# Patient Record
Sex: Male | Born: 1985 | Race: White | Hispanic: No | Marital: Single | Smoking: Never smoker
Health system: Southern US, Community
[De-identification: ages and names within clinical notes are randomized; demographics above are authoritative.]

## PROBLEM LIST (undated history)

## (undated) DIAGNOSIS — I10 Essential (primary) hypertension: Secondary | ICD-10-CM

## (undated) HISTORY — DX: Essential (primary) hypertension: I10

---
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
## 2006-07-29 IMAGING — CR RIGHT TIBIA AND FIBULA - 2 VIEW
1 series · 2 of 2 positions shown · non-contrast
Comparison: none

REASON FOR EXAM: EVALUTE FOR SOFT TISSUE OF LOWER EXTREMITITY (CALL REPORT)
COMMENTS:

[Series 1: view not recorded · 0.17mm/px · 2 of 2 slices shown]
[im 1/2]
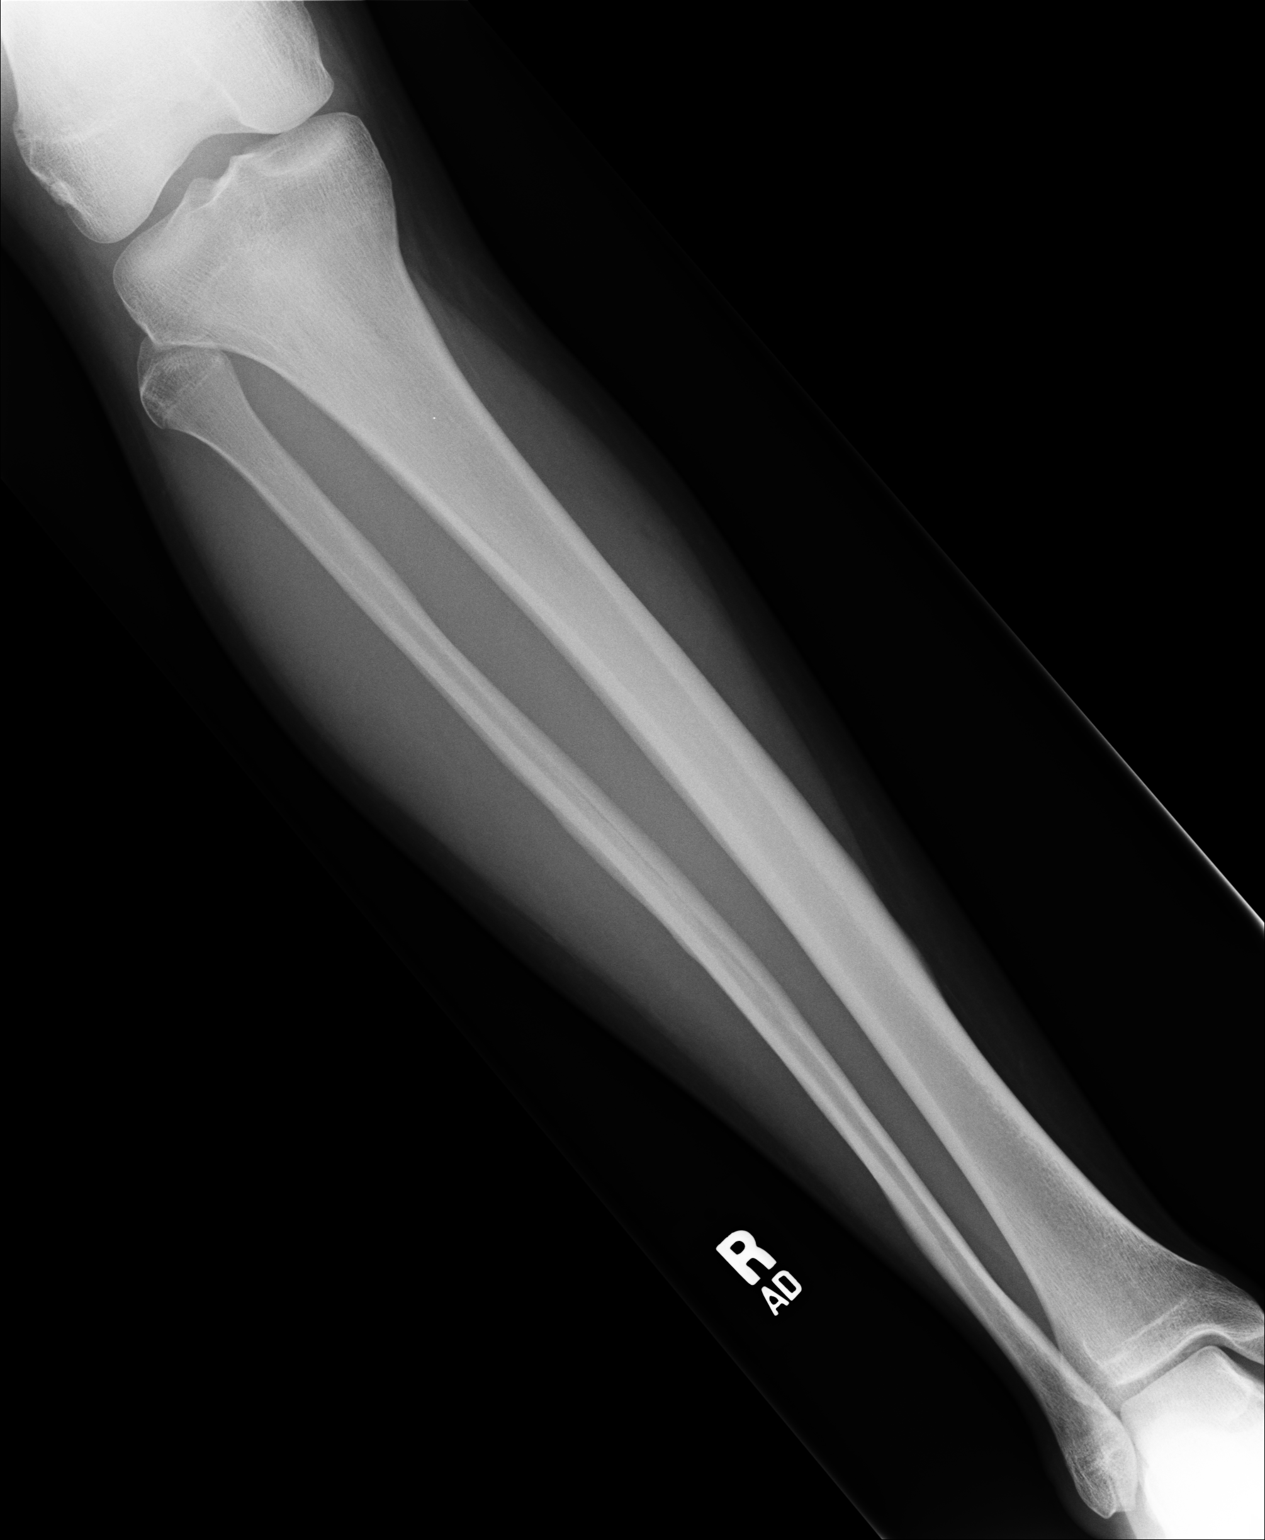
[im 2/2]
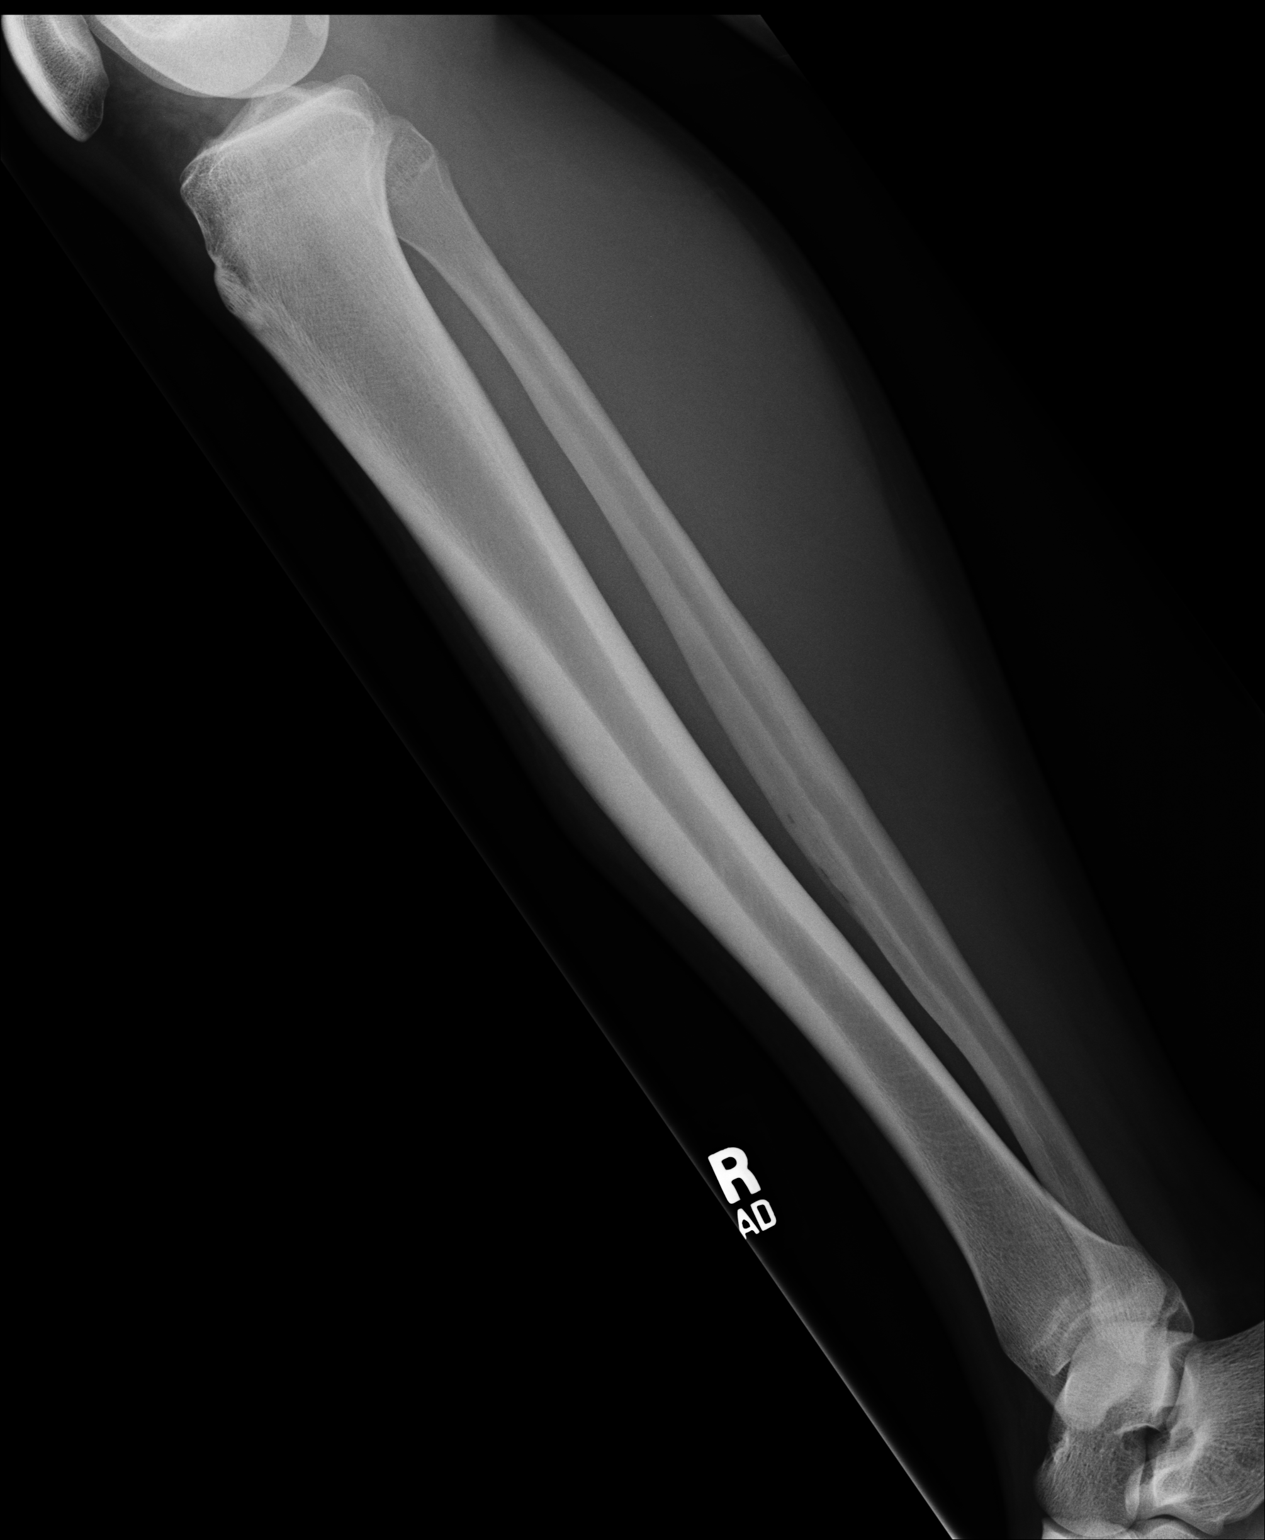

[2 of 2 positions shown; findings below may reference images not displayed]

PROCEDURE:     DXR - DXR TIBIA AND FIBULA RT (LOWER L  - March 27, 2005 [DATE]

RESULT:       AP and lateral views of the RIGHT lower leg show periosteal
thickening involving the mid shafts of the fibula and to a lesser extent the
midshaft of the RIGHT tibia.  The etiology for this is not certain.
Residual change from prior trauma or infection would be the primary
considerations.  If the patient is symptomatic in the midportion of the
RIGHT lower leg, then Bone Scan would be recommended to evaluate for active
disease.

No associated soft tissue abnormality is seen.  No radiodense foreign body
is noted. The patient reportedly has a knot on the anterior aspect of the
lower leg.  The etiology for this is not identified.
IMPRESSION: There is periosteal reaction involving the fibula and tibia. Bone Scan is
suggested for further evaluation.  Alternatively, MR could be performed
which would also provide the ability to evaluate the soft tissues.

## 2006-07-29 IMAGING — CR DG TIBIA/FIBULA 2V*L*
1 series · 2 of 2 positions shown · non-contrast
Comparison: none

REASON FOR EXAM: Evaluate for soft tissue of lower extremitity
                     (CALL REPORT)
COMMENTS:

[Series 1: view not recorded · 0.17mm/px · 2 of 2 slices shown]
[im 1/2]
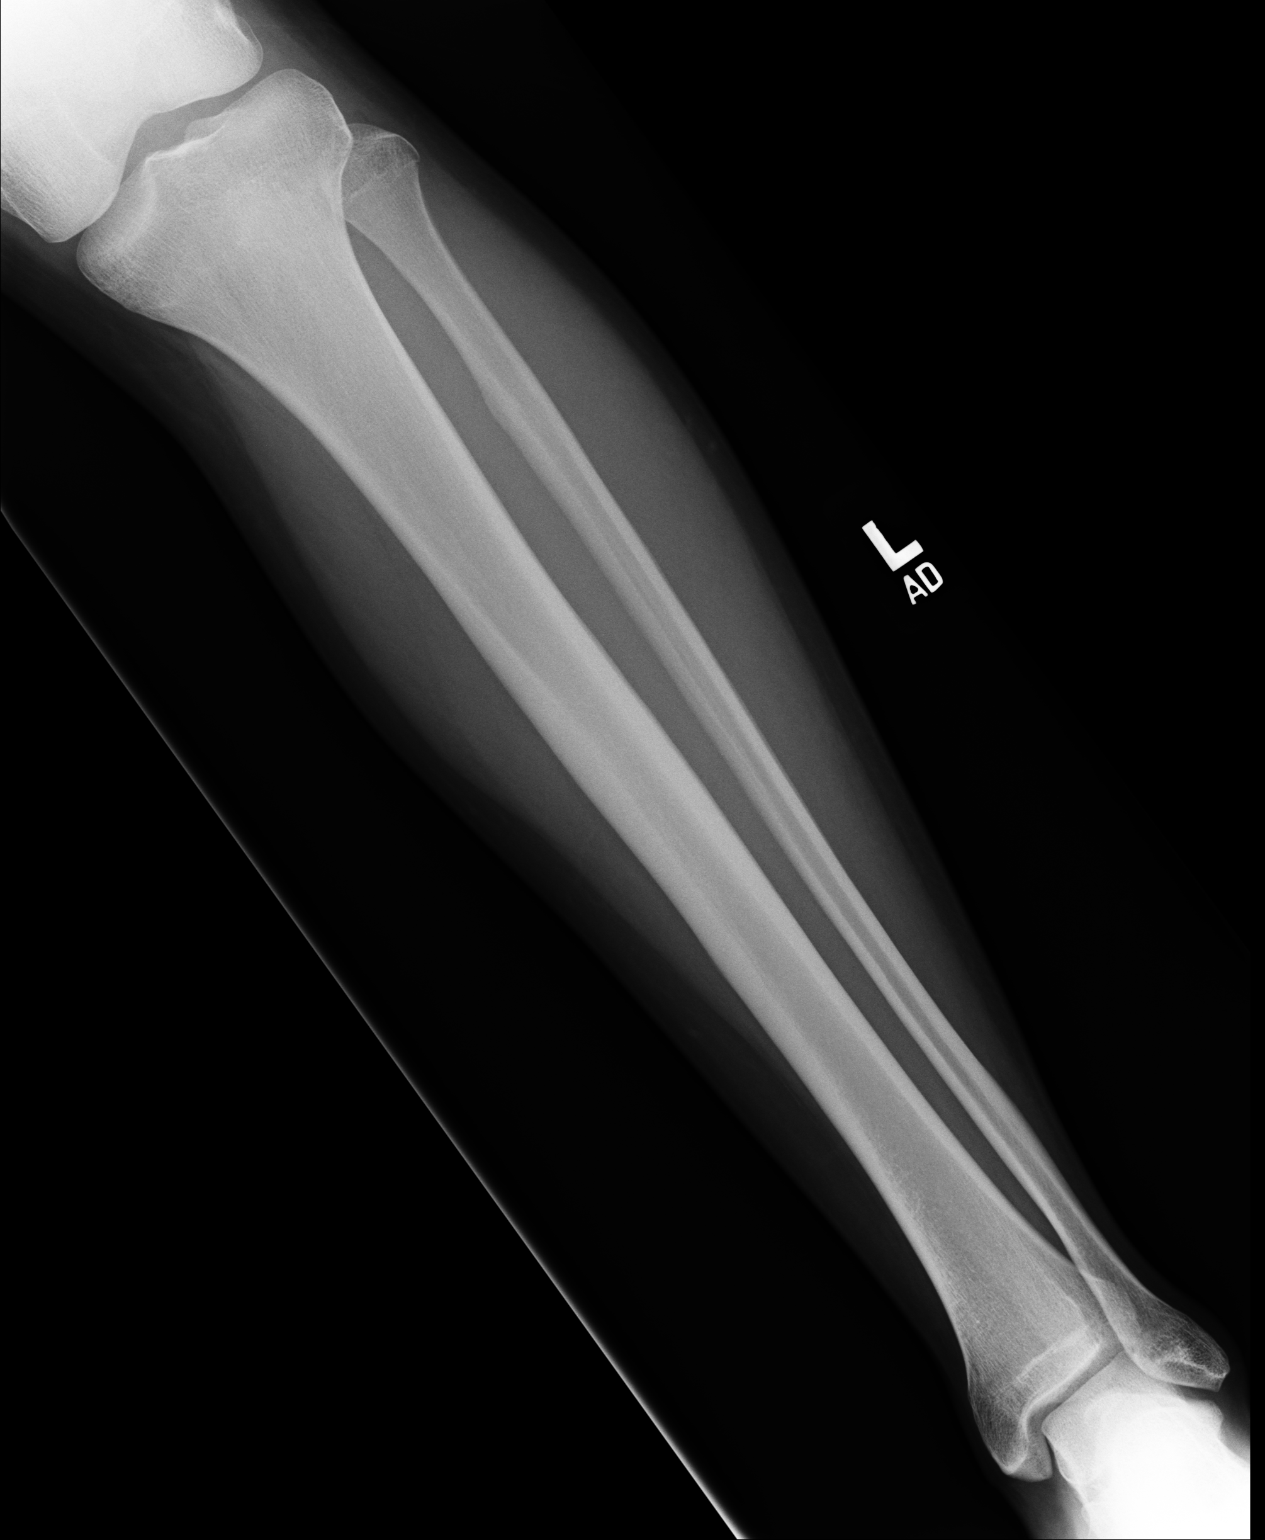
[im 2/2]
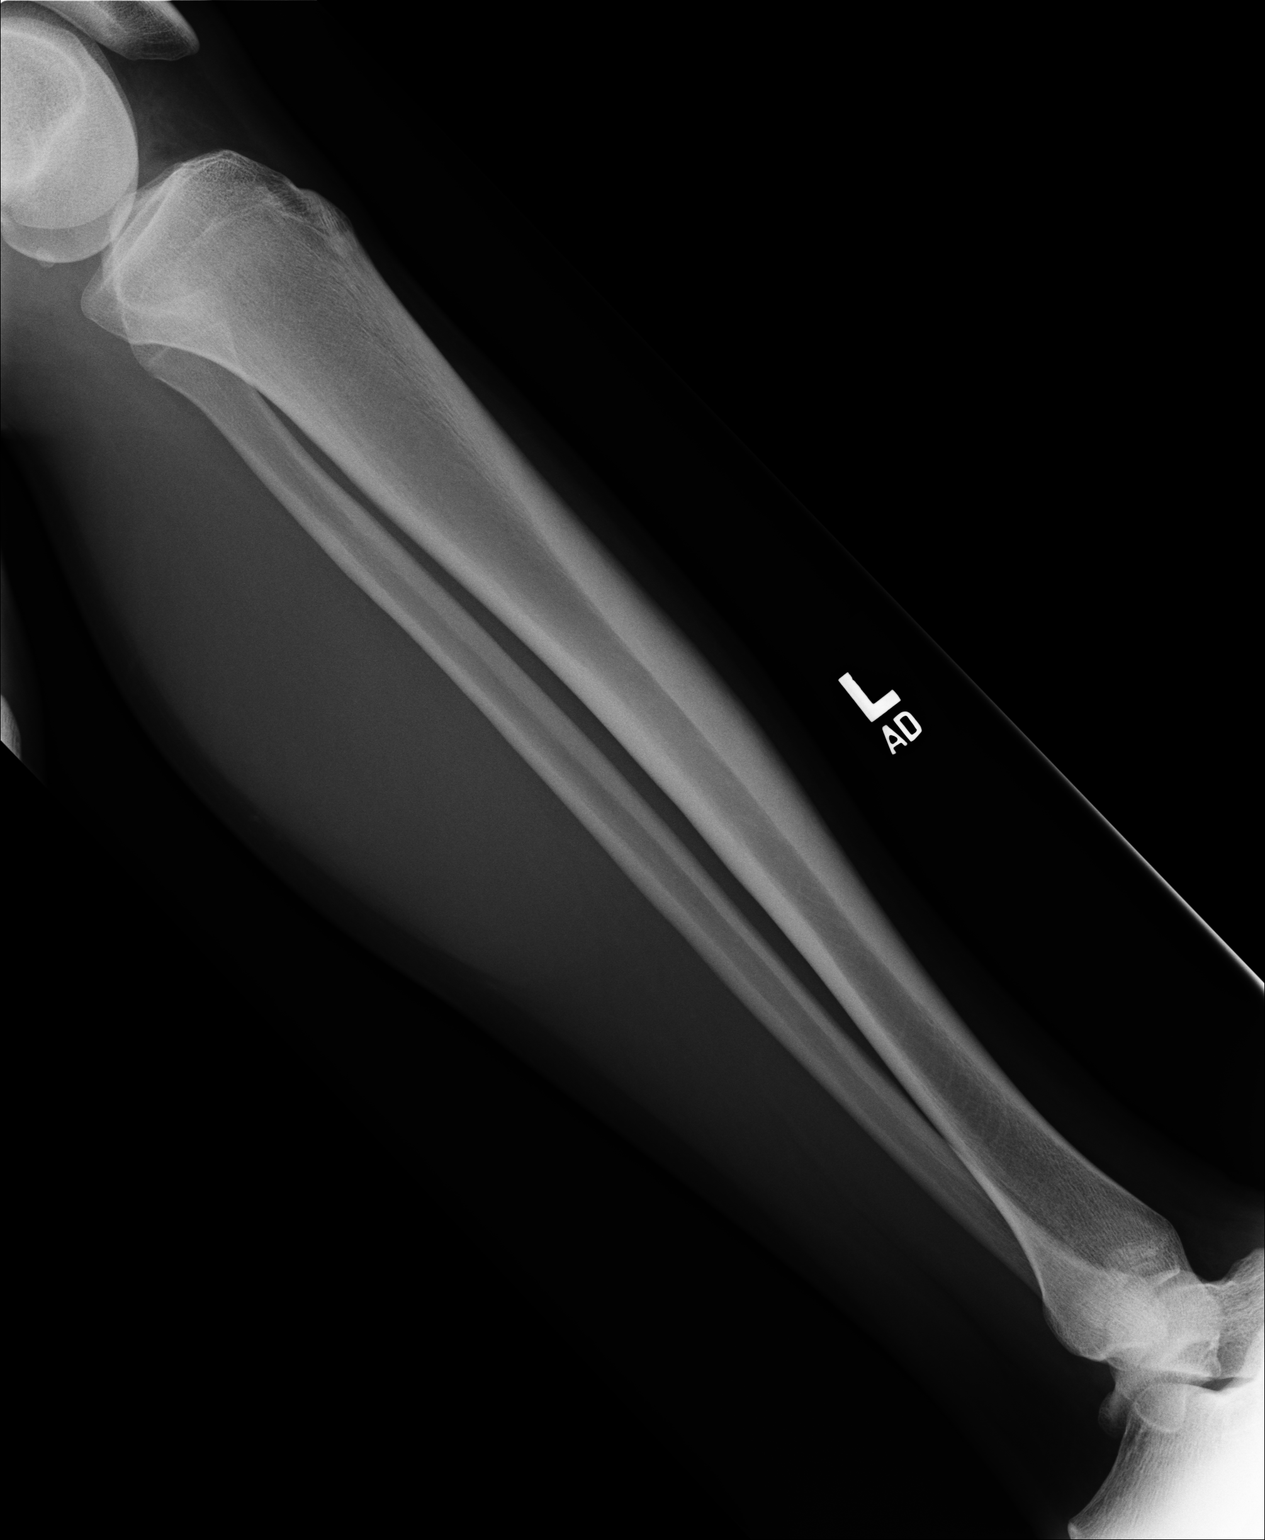

[2 of 2 positions shown; findings below may reference images not displayed]

PROCEDURE:     DXR - DXR TIBIA AND FIBULA LT (LOWER L  - March 27, 2005 [DATE]

RESULT:     AP and lateral views of the LEFT tibia and fibula show no
fracture, dislocation or other acute bony abnormality. No definite
periosteal reactive changes are seen on the LEFT. No soft tissue abnormality
is identified.
IMPRESSION: No significant abnormalities are noted.

## 2018-10-01 DIAGNOSIS — B349 Viral infection, unspecified: Secondary | ICD-10-CM | POA: Diagnosis not present

## 2018-10-01 DIAGNOSIS — J069 Acute upper respiratory infection, unspecified: Secondary | ICD-10-CM | POA: Diagnosis not present

## 2019-09-15 DIAGNOSIS — Z20828 Contact with and (suspected) exposure to other viral communicable diseases: Secondary | ICD-10-CM | POA: Diagnosis not present

## 2019-09-15 DIAGNOSIS — U071 COVID-19: Secondary | ICD-10-CM | POA: Diagnosis not present

## 2019-12-24 ENCOUNTER — Ambulatory Visit: Payer: Self-pay | Attending: Internal Medicine

## 2019-12-24 DIAGNOSIS — Z23 Encounter for immunization: Secondary | ICD-10-CM

## 2019-12-24 NOTE — Progress Notes (Signed)
   Covid-19 Vaccination Clinic  Name:  JACOBO MONCRIEF    MRN: [id number] DOB: 02-05-1986  12/24/2019  Mr. Blau was observed post Covid-19 immunization for 15 minutes without incident. He was provided with Vaccine Information Sheet and instruction to access the V-Safe system.   Mr. Aultman was instructed to call 911 with any severe reactions post vaccine: Marland Kitchen Difficulty breathing  . Swelling of face and throat  . A fast heartbeat  . A bad rash all over body  . Dizziness and weakness   Immunizations Administered    Name Date Dose VIS Date Route   Pfizer COVID-19 Vaccine 12/24/2019  5:49 PM 0.3 mL 09/09/2019 Intramuscular   Manufacturer: [employer], [employer]   Lot: [id number]   NDC: [address]-4

## 2020-01-14 ENCOUNTER — Other Ambulatory Visit: Payer: Self-pay

## 2020-01-14 ENCOUNTER — Ambulatory Visit: Payer: Self-pay | Attending: Internal Medicine

## 2020-01-14 DIAGNOSIS — Z23 Encounter for immunization: Secondary | ICD-10-CM

## 2020-01-14 NOTE — Progress Notes (Signed)
   Covid-19 Vaccination Clinic  Name:  Logan Rivas    MRN: [id number] DOB: Jun 27, 1986  01/14/2020  Mr. Logan Rivas was observed post Covid-19 immunization for 15 minutes without incident. He was provided with Vaccine Information Sheet and instruction to access the V-Safe system.   Mr. Logan Rivas was instructed to call 911 with any severe reactions post vaccine: Marland Kitchen Difficulty breathing  . Swelling of face and throat  . A fast heartbeat  . A bad rash all over body  . Dizziness and weakness   Immunizations Administered    Name Date Dose VIS Date Route   Pfizer COVID-19 Vaccine 01/14/2020  5:38 PM 0.3 mL 09/09/2019 Intramuscular   Manufacturer: [employer], [employer]   Lot: [id number]   NDC: [address]-4

## 2020-06-01 ENCOUNTER — Encounter: Payer: Self-pay | Admitting: Internal Medicine

## 2020-06-01 ENCOUNTER — Ambulatory Visit (INDEPENDENT_AMBULATORY_CARE_PROVIDER_SITE_OTHER): Payer: BC Managed Care – PPO | Admitting: Internal Medicine

## 2020-06-01 ENCOUNTER — Other Ambulatory Visit: Payer: Self-pay

## 2020-06-01 VITALS — BP 160/100 | HR 81 | Ht 71.0 in | Wt 186.6 lb

## 2020-06-01 DIAGNOSIS — Z23 Encounter for immunization: Secondary | ICD-10-CM | POA: Diagnosis not present

## 2020-06-01 DIAGNOSIS — I10 Essential (primary) hypertension: Secondary | ICD-10-CM | POA: Insufficient documentation

## 2020-06-01 LAB — POCT URINALYSIS DIPSTICK
Bilirubin, UA: NEGATIVE
Blood, UA: NEGATIVE
Glucose, UA: NEGATIVE
Ketones, UA: NEGATIVE
Leukocytes, UA: NEGATIVE
Nitrite, UA: NEGATIVE
Protein, UA: NEGATIVE
Spec Grav, UA: 1.015 (ref 1.010–1.025)
Urobilinogen, UA: NEGATIVE E.U./dL — AB
pH, UA: 7 (ref 5.0–8.0)

## 2020-06-01 MED ORDER — METOPROLOL SUCCINATE ER 50 MG PO TB24: 50.0000 mg | ORAL_TABLET | Freq: Every day | ORAL | 3 refills | Status: AC

## 2020-06-01 NOTE — Assessment & Plan Note (Signed)
He was started on metroprolol for HTN. He denies any chest pain or shortness of breath. There is no evidence of kidney problem. There is no swelling of the legs. He has a family hx of HTN. His blood pressure was checked by me again and it was 160/100, so he was started on a beta blocker. We'll do a CBC and CMP to evaluate the liver and kidney function. He had a history of COVID19 late 2020. He does not have any symptoms right now and has taken both COVID19 vaccines. He will be given a flu shot today.

## 2020-06-01 NOTE — Addendum Note (Signed)
Addended by: Jobie Quaker on: 06/01/2020 03:27 PM   Modules accepted: Orders

## 2020-06-01 NOTE — Patient Instructions (Signed)
Hypertension, Adult Hypertension is another name for high blood pressure. High blood pressure forces your heart to work harder to pump blood. This can cause problems over time. There are two numbers in a blood pressure reading. There is a top number (systolic) over a bottom number (diastolic). It is best to have a blood pressure that is below 120/80. Healthy choices can help lower your blood pressure, or you may need medicine to help lower it.  What are the causes? The cause of this condition is not known. Some conditions may be related to high blood pressure.  What increases the risk?  Smoking.  Having type 2 diabetes mellitus, high cholesterol, or both.  Not getting enough exercise or physical activity.  Being overweight.  Having too much fat, sugar, calories, or salt (sodium) in your diet.  Drinking too much alcohol.  Having long-term (chronic) kidney disease.  Having a family history of high blood pressure.  Age. Risk increases with age.  Race. You may be at higher risk if you are African American.  Gender. Men are at higher risk than women before age 51. After age 36, women are at higher risk than men.  Having obstructive sleep apnea.  Stress.  What are the signs or symptoms? 1. High blood pressure may not cause symptoms. Very high blood pressure (hypertensive crisis) may cause: ? Headache. ? Feelings of worry or nervousness (anxiety). ? Shortness of breath. ? Nosebleed. ? A feeling of being sick to your stomach (nausea). ? Throwing up (vomiting). ? Changes in how you see. ? Very bad chest pain. ? Seizures.  How is this treated? 1. This condition is treated by making healthy lifestyle changes, such as: ? Eating healthy foods. ? Exercising more. ? Drinking less alcohol. 2. Your health care provider may prescribe medicine if lifestyle changes are not enough to get your blood pressure under control, and if: ? Your top number is above 130. ? Your bottom number  is above 80. 3. Your personal target blood pressure may vary.  Follow these instructions at home: Eating and drinking  1. If told, follow the DASH eating plan. To follow this plan: 1. Fill one half of your plate at each meal with fruits and vegetables. 2. Fill one fourth of your plate at each meal with whole grains. Whole grains include whole-wheat pasta, brown rice, and whole-grain bread. 3. Eat or drink low-fat dairy products, such as skim milk or low-fat yogurt. 4. Fill one fourth of your plate at each meal with low-fat (lean) proteins. Low-fat proteins include fish, chicken without skin, eggs, beans, and tofu. 5. Avoid fatty meat, cured and processed meat, or chicken with skin. 6. Avoid pre-made or processed food. 2. Eat less than 1,500 mg of salt each day. 3. Do not drink alcohol if: 1. Your doctor tells you not to drink. 2. You are pregnant, may be pregnant, or are planning to become pregnant. 4. If you drink alcohol: 1. Limit how much you use to:  0-1 drink a day for women.  0-2 drinks a day for men. 2. Be aware of how much alcohol is in your drink. In the [place] one drink equals one 12 oz bottle of beer (355 mL), one 5 oz glass of wine (148 mL), or one 1 oz glass of hard liquor (44 mL).  Lifestyle   Work with your doctor to stay at a healthy weight or to lose weight. Ask your doctor what the best weight is for you.  Get at  least 30 minutes of exercise most days of the week. This may include walking, swimming, or biking.  Get at least 30 minutes of exercise that strengthens your muscles (resistance exercise) at least 3 days a week. This may include lifting weights or doing Pilates.  Do not use any products that contain nicotine or tobacco, such as cigarettes, e-cigarettes, and chewing tobacco. If you need help quitting, ask your doctor.  Check your blood pressure at home as told by your doctor.  Keep all follow-up visits as told by your doctor. This is  important.  Medicines  Take over-the-counter and prescription medicines only as told by your doctor. Follow directions carefully.  Do not skip doses of blood pressure medicine. The medicine does not work as well if you skip doses. Skipping doses also puts you at risk for problems.  Ask your doctor about side effects or reactions to medicines that you should watch for.  Contact a doctor if you:  Think you are having a reaction to the medicine you are taking.  Have headaches that keep coming back (recurring).  Feel dizzy.  Have swelling in your ankles.  Have trouble with your vision.  Get help right away if you: 1. Get a very bad headache. 2. Start to feel mixed up (confused). 3. Feel weak or numb. 4. Feel faint. 5. Have very bad pain in your: ? Chest. ? Belly (abdomen). 6. Throw up more than once. 7. Have trouble breathing.  Summary  Hypertension is another name for high blood pressure.  High blood pressure forces your heart to work harder to pump blood.  For most people, a normal blood pressure is less than 120/80.  Making healthy choices can help lower blood pressure. If your blood pressure does not get lower with healthy choices, you may need to take medicine.  This information is not intended to replace advice given to you by your health care provider. Make sure you discuss any questions you have with your health care provider.  Document Revised: 05/26/2018 Document Reviewed: 05/26/2018 Elsevier Patient Education  2020 [employer].

## 2020-06-01 NOTE — Progress Notes (Signed)
Established Patient Office Visit  SUBJECTIVE:  Subjective  Patient ID: Logan Rivas, male    DOB: [birth year]  Age: 34 y.o. MRN: [id number]  CC:  Chief Complaint  Patient presents with  . New Patient (Initial Visit)    patient has concerns of high blood pressure, he states that he has known to have elevated blood pressure for the past couple of years, but has notice an increase     HPI Logan Rivas is a 34 y.o. male presenting today for a new patient evaluation.  He notes that he has a high blood pressure. It was 175/107 at check-in via machine and 160/100 manually. He states that he has been under a lot of stress in the last year, but that his blood pressure was elevated prior to his increased stress.   He is vaccinated against [place]. He had COVID at the end of 2020, but he is now unsystematic. He is interested in getting the flu shot.   He works in [place], [place].    Past Medical History:  Diagnosis Date  . High blood pressure     History reviewed. No pertinent surgical history.  Family History  Problem Relation Age of Onset  . Hypertension Mother   . Hypertension Father   . Cancer Maternal Grandfather   . Cancer Paternal Grandfather   . Stroke Paternal Aunt   . Hypertension Paternal Aunt     Social History   Socioeconomic History  . Marital status: Single    Spouse name: Not on file  . Number of children: Not on file  . Years of education: Not on file  . Highest education level: Not on file  Occupational History  . Occupation: office work  Tobacco Use  . Smoking status: Never Smoker  . Smokeless tobacco: Never Used  Substance and Sexual Activity  . Alcohol use: Yes  . Drug use: Never  . Sexual activity: Yes  Other Topics Concern  . Not on file  Social History Narrative  . Not on file   Social Determinants of Health   Financial Resource Strain:   . Difficulty of Paying Living Expenses: Not on file  Food Insecurity:   . Worried About Paediatric nurse in the Last Year: Not on file  . Ran Out of Food in the Last Year: Not on file  Transportation Needs:   . Lack of Transportation (Medical): Not on file  . Lack of Transportation (Non-Medical): Not on file  Physical Activity:   . Days of Exercise per Week: Not on file  . Minutes of Exercise per Session: Not on file  Stress:   . Feeling of Stress : Not on file  Social Connections:   . Frequency of Communication with Friends and Family: Not on file  . Frequency of Social Gatherings with Friends and Family: Not on file  . Attends Religious Services: Not on file  . Active Member of Clubs or Organizations: Not on file  . Attends Archivist Meetings: Not on file  . Marital Status: Not on file  Intimate Partner Violence:   . Fear of Current or Ex-Partner: Not on file  . Emotionally Abused: Not on file  . Physically Abused: Not on file  . Sexually Abused: Not on file     Current Outpatient Medications:  .  metoprolol succinate (TOPROL-XL) 50 MG 24 hr tablet, Take 1 tablet (50 mg total) by mouth daily. Take with or immediately following a meal., Disp: 90  tablet, Rfl: 3   No Known Allergies  ROS Review of Systems  Constitutional: Negative.   HENT: Negative.   Eyes: Negative.   Respiratory: Negative.  Negative for chest tightness and shortness of breath.   Cardiovascular: Negative.  Negative for chest pain and leg swelling.  Gastrointestinal: Negative.  Negative for abdominal pain.  Endocrine: Negative.   Genitourinary: Negative.  Negative for difficulty urinating and frequency.  Musculoskeletal: Negative.   Skin: Negative.   Allergic/Immunologic: Negative.   Neurological: Negative.   Hematological: Negative.   Psychiatric/Behavioral: The patient is nervous/anxious.   All other systems reviewed and are negative.    OBJECTIVE:    Physical Exam Vitals reviewed.  Constitutional:      Appearance: Normal appearance.  HENT:     Mouth/Throat:     Mouth:  Mucous membranes are moist.  Eyes:     Pupils: Pupils are equal, round, and reactive to light.  Neck:     Thyroid: No thyroid mass or thyroid tenderness.     Vascular: No carotid bruit.  Cardiovascular:     Rate and Rhythm: Normal rate and regular rhythm.     Pulses: Normal pulses.     Heart sounds: Normal heart sounds.     Comments: Mid systolic click Pulmonary:     Effort: Pulmonary effort is normal.     Breath sounds: Normal breath sounds.  Abdominal:     General: Bowel sounds are normal.     Palpations: Abdomen is soft. There is no hepatomegaly, splenomegaly or mass.     Tenderness: There is no abdominal tenderness.     Hernia: No hernia is present.  Musculoskeletal:     Cervical back: Neck supple.     Right lower leg: No edema.     Left lower leg: No edema.  Skin:    Findings: No rash.  Neurological:     Mental Status: He is alert and oriented to person, place, and time.     Motor: No weakness.  Psychiatric:        Mood and Affect: Mood normal.        Behavior: Behavior normal.     BP (!) 160/100 (BP Location: Left Arm, Patient Position: Sitting)   Pulse 81   Ht '5\' 11"'  (1.803 m)   Wt 186 lb 9.6 oz (84.6 kg)   BMI 26.03 kg/m  Wt Readings from Last 3 Encounters:  06/01/20 186 lb 9.6 oz (84.6 kg)    Health Maintenance Due  Topic Date Due  . Hepatitis C Screening  Never done  . HIV Screening  Never done  . INFLUENZA VACCINE  Never done    There are no preventive care reminders to display for this patient.  No flowsheet data found. No flowsheet data found.  No results found for: TSH No results found for: ALBUMIN, ANIONGAP, EGFR, GFR No results found for: CHOL, HDL, LDLCALC, CHOLHDL No results found for: TRIG No results found for: HGBA1C    ASSESSMENT & PLAN:   Problem List Items Addressed This Visit      Cardiovascular and Mediastinum   Essential hypertension - Primary    He was started on metroprolol for HTN. He denies any chest pain or shortness  of breath. There is no evidence of kidney problem. There is no swelling of the legs. He has a family hx of HTN. His blood pressure was checked by me again and it was 160/100, so he was started on a beta blocker. We'll do a  CBC and CMP to evaluate the liver and kidney function. He had a history of COVID19 late 2020. He does not have any symptoms right now and has taken both COVID19 vaccines. He will be given a flu shot today.       Relevant Medications   metoprolol succinate (TOPROL-XL) 50 MG 24 hr tablet      Meds ordered this encounter  Medications  . metoprolol succinate (TOPROL-XL) 50 MG 24 hr tablet    Sig: Take 1 tablet (50 mg total) by mouth daily. Take with or immediately following a meal.    Dispense:  90 tablet    Refill:  3    Follow-up: Return in about 1 month (around 07/01/2020).    Dr. [facility] [address], [place], [place] [address]   By signing my name below, I, [employer], attest that this documentation has been prepared under the direction and in the presence of Cletis Athens, MD. Electronically Signed: Cletis Athens, MD 06/01/20, 2:54 PM   I personally performed the services described in this documentation, which was SCRIBED in my presence. The recorded information has been reviewed and considered accurate. It has been edited as necessary during review. Cletis Athens, MD

## 2020-06-02 LAB — COMPLETE METABOLIC PANEL WITH GFR
AG Ratio: 1.9 (calc) (ref 1.0–2.5)
ALT: 16 U/L (ref 9–46)
AST: 23 U/L (ref 10–40)
Albumin: 4.9 g/dL (ref 3.6–5.1)
Alkaline phosphatase (APISO): 35 U/L — ABNORMAL LOW (ref 36–130)
BUN: 13 mg/dL (ref 7–25)
CO2: 24 mmol/L (ref 20–32)
Calcium: 9.5 mg/dL (ref 8.6–10.3)
Chloride: 102 mmol/L (ref 98–110)
Creat: 1.02 mg/dL (ref 0.60–1.35)
GFR, Est African American: 112 mL/min/{1.73_m2} (ref >=60)
GFR, Est Non African American: 97 mL/min/{1.73_m2} (ref >=60)
Globulin: 2.6 g/dL (calc) (ref 1.9–3.7)
Glucose, Bld: 91 mg/dL (ref 65–99)
Potassium: 4.3 mmol/L (ref 3.5–5.3)
Sodium: 140 mmol/L (ref 135–146)
Total Bilirubin: 1.3 mg/dL — ABNORMAL HIGH (ref 0.2–1.2)
Total Protein: 7.5 g/dL (ref 6.1–8.1)

## 2020-06-02 LAB — LIPID PANEL
Cholesterol: 152 mg/dL (ref <200)
HDL: 55 mg/dL (ref >=40)
LDL Cholesterol (Calc): 82 mg/dL (calc)
Non-HDL Cholesterol (Calc): 97 mg/dL (calc) (ref <130)
Total CHOL/HDL Ratio: 2.8 (calc) (ref <5.0)
Triglycerides: 70 mg/dL (ref <150)

## 2020-06-02 LAB — CBC WITH DIFFERENTIAL/PLATELET
Absolute Monocytes: 511 cells/uL (ref 200–950)
Basophils Absolute: 62 cells/uL (ref 0–200)
Basophils Relative: 0.9 %
Eosinophils Absolute: 90 cells/uL (ref 15–500)
Eosinophils Relative: 1.3 %
HCT: 46.5 % (ref 38.5–50.0)
Hemoglobin: 16.1 g/dL (ref 13.2–17.1)
Lymphs Abs: 1815 cells/uL (ref 850–3900)
MCH: 32.8 pg (ref 27.0–33.0)
MCHC: 34.6 g/dL (ref 32.0–36.0)
MCV: 94.7 fL (ref 80.0–100.0)
MPV: 10.5 fL (ref 7.5–12.5)
Monocytes Relative: 7.4 %
Neutro Abs: 4423 cells/uL (ref 1500–7800)
Neutrophils Relative %: 64.1 %
Platelets: 227 10*3/uL (ref 140–400)
RBC: 4.91 10*6/uL (ref 4.20–5.80)
RDW: 11.6 % (ref 11.0–15.0)
Total Lymphocyte: 26.3 %
WBC: 6.9 10*3/uL (ref 3.8–10.8)

## 2020-06-02 LAB — TSH: TSH: 1.69 mIU/L (ref 0.40–4.50)

## 2020-07-06 ENCOUNTER — Other Ambulatory Visit: Payer: Self-pay

## 2020-07-06 ENCOUNTER — Ambulatory Visit (INDEPENDENT_AMBULATORY_CARE_PROVIDER_SITE_OTHER): Payer: BC Managed Care – PPO | Admitting: Family Medicine

## 2020-07-06 ENCOUNTER — Encounter: Payer: Self-pay | Admitting: Family Medicine

## 2020-07-06 ENCOUNTER — Ambulatory Visit: Payer: BC Managed Care – PPO | Admitting: Internal Medicine

## 2020-07-06 VITALS — BP 126/88 | HR 61 | Ht 74.0 in | Wt 194.2 lb

## 2020-07-06 DIAGNOSIS — I10 Essential (primary) hypertension: Secondary | ICD-10-CM | POA: Diagnosis not present

## 2020-07-06 DIAGNOSIS — Z0000 Encounter for general adult medical examination without abnormal findings: Secondary | ICD-10-CM | POA: Insufficient documentation

## 2020-07-06 DIAGNOSIS — Z0189 Encounter for other specified special examinations: Secondary | ICD-10-CM | POA: Diagnosis not present

## 2020-07-06 NOTE — Assessment & Plan Note (Signed)
Discussed previous labs with patient, will repeat labs in 2 months, Total Billi discussed with pt.

## 2020-07-06 NOTE — Assessment & Plan Note (Signed)
Patient's blood pressure is within the desired range. Medication side effects include: no side effects noted Continue current treatment regimen. On no meds for BP and needs none at this time. Continue current medications.

## 2020-07-06 NOTE — Progress Notes (Signed)
Established Patient Office Visit  SUBJECTIVE:  Subjective  Patient ID: Logan Rivas, male    DOB: 12-05-1985  Age: 34 y.o. MRN: [id number]  CC:  Chief Complaint  Patient presents with  . Hypertension    follow up BP     HPI Logan Rivas is a 34 y.o. male presenting today for a hypertension follow up.   Hypertension This is a chronic problem. The current episode started more than 1 year ago. The problem is unchanged. The problem is uncontrolled. Associated symptoms include malaise/fatigue. Pertinent negatives include no anxiety, chest pain, headaches or shortness of breath. Risk factors for coronary artery disease include male gender, stress and family history. Treatments tried: metoprolol 68m 1 month. The current treatment provides mild improvement. There are no compliance problems.  There is no history of CAD/MI or CVA. There is no history of a hypertension causing med, sleep apnea or a thyroid problem.   Labs completed on 06/01/2020. CBC was within normal limits. CMP was within normal limits except for total bilirubin 1.3, Alkaline phosphatase 35. Lipid panel revealed cholesterol 152, HDL 55, triglycerides 70, LDL 82. TSH was 1.69.   Past Medical History:  Diagnosis Date  . High blood pressure     History reviewed. No pertinent surgical history.  Family History  Problem Relation Age of Onset  . Hypertension Mother   . Hypertension Father   . Cancer Maternal Grandfather   . Cancer Paternal Grandfather   . Stroke Paternal Aunt   . Hypertension Paternal Aunt     Social History   Socioeconomic History  . Marital status: Single    Spouse name: Not on file  . Number of children: Not on file  . Years of education: Not on file  . Highest education level: Not on file  Occupational History  . Occupation: office work  Tobacco Use  . Smoking status: Never Smoker  . Smokeless tobacco: Never Used  Substance and Sexual Activity  . Alcohol use: Yes  . Drug use: Never    . Sexual activity: Yes  Other Topics Concern  . Not on file  Social History Narrative  . Not on file   Social Determinants of Health   Financial Resource Strain:   . Difficulty of Paying Living Expenses: Not on file  Food Insecurity:   . Worried About RCharity fundraiserin the Last Year: Not on file  . Ran Out of Food in the Last Year: Not on file  Transportation Needs:   . Lack of Transportation (Medical): Not on file  . Lack of Transportation (Non-Medical): Not on file  Physical Activity:   . Days of Exercise per Week: Not on file  . Minutes of Exercise per Session: Not on file  Stress:   . Feeling of Stress : Not on file  Social Connections:   . Frequency of Communication with Friends and Family: Not on file  . Frequency of Social Gatherings with Friends and Family: Not on file  . Attends Religious Services: Not on file  . Active Member of Clubs or Organizations: Not on file  . Attends CArchivistMeetings: Not on file  . Marital Status: Not on file  Intimate Partner Violence:   . Fear of Current or Ex-Partner: Not on file  . Emotionally Abused: Not on file  . Physically Abused: Not on file  . Sexually Abused: Not on file     Current Outpatient Medications:  .  metoprolol succinate (TOPROL-XL) 50  MG 24 hr tablet, Take 1 tablet (50 mg total) by mouth daily. Take with or immediately following a meal., Disp: 90 tablet, Rfl: 3   No Known Allergies  ROS Review of Systems  Constitutional: Positive for malaise/fatigue.  HENT: Negative.   Eyes: Negative.   Respiratory: Negative.  Negative for chest tightness and shortness of breath.   Cardiovascular: Negative.  Negative for chest pain.  Gastrointestinal: Negative.   Endocrine: Negative.   Genitourinary: Negative.   Musculoskeletal: Negative.   Skin: Negative.   Allergic/Immunologic: Negative.   Neurological: Negative.  Negative for headaches.  Hematological: Negative.   Psychiatric/Behavioral: Negative.   The patient is not nervous/anxious.   All other systems reviewed and are negative.    OBJECTIVE:    Physical Exam Vitals reviewed.  Constitutional:      Appearance: Normal appearance. He is normal weight.  HENT:     Mouth/Throat:     Mouth: Mucous membranes are moist.  Eyes:     Pupils: Pupils are equal, round, and reactive to light.  Neck:     Vascular: No carotid bruit.  Cardiovascular:     Rate and Rhythm: Normal rate and regular rhythm.     Pulses: Normal pulses.     Heart sounds: Normal heart sounds.  Pulmonary:     Effort: Pulmonary effort is normal.     Breath sounds: Normal breath sounds.  Abdominal:     General: Bowel sounds are normal.     Palpations: Abdomen is soft. There is no hepatomegaly, splenomegaly or mass.     Tenderness: There is no abdominal tenderness.     Hernia: No hernia is present.  Musculoskeletal:     Cervical back: Neck supple.     Right lower leg: No edema.     Left lower leg: No edema.  Skin:    Findings: No rash.  Neurological:     Mental Status: He is alert and oriented to person, place, and time.     Motor: No weakness.  Psychiatric:        Mood and Affect: Mood normal.        Behavior: Behavior normal.     Vitals with BMI 07/06/2020 07/06/2020 07/06/2020  Height - - '6\' 2"'   Weight - - 194 lbs 3 oz  BMI - - 08.65  Systolic 784 696 295  Diastolic 88 74 88  Pulse - - 61     Health Maintenance Due  Topic Date Due  . Hepatitis C Screening  Never done  . HIV Screening  Never done    There are no preventive care reminders to display for this patient.  CBC Latest Ref Rng & Units 06/01/2020  WBC 3.8 - 10.8 Thousand/uL 6.9  Hemoglobin 13.2 - 17.1 g/dL 16.1  Hematocrit 38 - 50 % 46.5  Platelets 140 - 400 Thousand/uL 227   CMP Latest Ref Rng & Units 06/01/2020  Glucose 65 - 99 mg/dL 91  BUN 7 - 25 mg/dL 13  Creatinine 0.60 - 1.35 mg/dL 1.02  Sodium 135 - 146 mmol/L 140  Potassium 3.5 - 5.3 mmol/L 4.3  Chloride 98 - 110 mmol/L  102  CO2 20 - 32 mmol/L 24  Calcium 8.6 - 10.3 mg/dL 9.5  Total Protein 6.1 - 8.1 g/dL 7.5  Total Bilirubin 0.2 - 1.2 mg/dL 1.3(H)  AST 10 - 40 U/L 23  ALT 9 - 46 U/L 16    Lab Results  Component Value Date   TSH 1.69 06/01/2020  No results found for: ALBUMIN, ANIONGAP, EGFR, GFR Lab Results  Component Value Date   CHOL 152 06/01/2020   HDL 55 06/01/2020   LDLCALC 82 06/01/2020   CHOLHDL 2.8 06/01/2020   Lab Results  Component Value Date   TRIG 70 06/01/2020   No results found for: HGBA1C    ASSESSMENT & PLAN:   Problem List Items Addressed This Visit      Cardiovascular and Mediastinum   Essential hypertension - Primary    Patient's blood pressure is within the desired range. Medication side effects include: no side effects noted Continue current treatment regimen. On no meds for BP and needs none at this time. Continue current medications.         Other   Encounter for laboratory test    Discussed previous labs with patient, will repeat labs in 2 months, Total Billi discussed with pt.          No orders of the defined types were placed in this encounter.   Follow-up: No follow-ups on file.    Beckie Salts, [place] [address], [place], [place] [address]   By signing my name below, I, [employer], attest that this documentation has been prepared under the direction and in the presence of Beckie Salts, FNP Electronically Signed: Beckie Salts, [place] 07/06/20, 10:36 AM  I personally performed the services described in this documentation, which was SCRIBED in my presence. The recorded information has been reviewed and considered accurate. It has been edited as necessary during review. Beckie Salts, FNP

## 2020-08-03 ENCOUNTER — Encounter: Payer: Self-pay | Admitting: Family Medicine

## 2020-08-03 ENCOUNTER — Ambulatory Visit (INDEPENDENT_AMBULATORY_CARE_PROVIDER_SITE_OTHER): Payer: BC Managed Care – PPO | Admitting: Family Medicine

## 2020-08-03 ENCOUNTER — Other Ambulatory Visit: Payer: Self-pay

## 2020-08-03 VITALS — BP 130/75 | HR 60 | Ht 71.0 in | Wt 201.3 lb

## 2020-08-03 DIAGNOSIS — R17 Unspecified jaundice: Secondary | ICD-10-CM

## 2020-08-03 DIAGNOSIS — I10 Essential (primary) hypertension: Secondary | ICD-10-CM

## 2020-08-04 LAB — BILIRUBIN, TOTAL: Total Bilirubin: 0.9 mg/dL (ref 0.2–1.2)

## 2020-08-17 NOTE — Progress Notes (Signed)
Established Patient Office Visit  SUBJECTIVE:  Subjective  Patient ID: Logan Rivas, male    DOB: July 11, 1986  Age: 34 y.o. MRN: [id number]  CC:  Chief Complaint  Patient presents with  . Hypertension    Patient is here today for a 1 month follow up on his blood pressure     HPI Logan Rivas is a 34 y.o. male presenting today for   Past Medical History:  Diagnosis Date  . High blood pressure     History reviewed. No pertinent surgical history.  Family History  Problem Relation Age of Onset  . Hypertension Mother   . Hypertension Father   . Cancer Maternal Grandfather   . Cancer Paternal Grandfather   . Stroke Paternal Aunt   . Hypertension Paternal Aunt     Social History   Socioeconomic History  . Marital status: Single    Spouse name: Not on file  . Number of children: Not on file  . Years of education: Not on file  . Highest education level: Not on file  Occupational History  . Occupation: office work  Tobacco Use  . Smoking status: Never Smoker  . Smokeless tobacco: Never Used  Substance and Sexual Activity  . Alcohol use: Yes  . Drug use: Never  . Sexual activity: Yes  Other Topics Concern  . Not on file  Social History Narrative  . Not on file   Social Determinants of Health   Financial Resource Strain:   . Difficulty of Paying Living Expenses: Not on file  Food Insecurity:   . Worried About Charity fundraiser in the Last Year: Not on file  . Ran Out of Food in the Last Year: Not on file  Transportation Needs:   . Lack of Transportation (Medical): Not on file  . Lack of Transportation (Non-Medical): Not on file  Physical Activity:   . Days of Exercise per Week: Not on file  . Minutes of Exercise per Session: Not on file  Stress:   . Feeling of Stress : Not on file  Social Connections:   . Frequency of Communication with Friends and Family: Not on file  . Frequency of Social Gatherings with Friends and Family: Not on file  . Attends  Religious Services: Not on file  . Active Member of Clubs or Organizations: Not on file  . Attends Archivist Meetings: Not on file  . Marital Status: Not on file  Intimate Partner Violence:   . Fear of Current or Ex-Partner: Not on file  . Emotionally Abused: Not on file  . Physically Abused: Not on file  . Sexually Abused: Not on file     Current Outpatient Medications:  .  metoprolol succinate (TOPROL-XL) 50 MG 24 hr tablet, Take 1 tablet (50 mg total) by mouth daily. Take with or immediately following a meal., Disp: 90 tablet, Rfl: 3   No Known Allergies  ROS Review of Systems  Constitutional: Negative.   HENT: Negative.   Respiratory: Negative.   Cardiovascular: Negative.   Gastrointestinal: Negative.   Endocrine: Negative.   Genitourinary: Negative.   Musculoskeletal: Negative.   Skin: Negative.   Neurological: Negative.   Psychiatric/Behavioral: Negative.   All other systems reviewed and are negative.    OBJECTIVE:    Physical Exam Vitals and nursing note reviewed.  Constitutional:      Appearance: Normal appearance.  HENT:     Right Ear: Tympanic membrane normal.     Left  Ear: Tympanic membrane normal.     Mouth/Throat:     Mouth: Mucous membranes are moist.  Eyes:     Pupils: Pupils are equal, round, and reactive to light.  Cardiovascular:     Rate and Rhythm: Normal rate.     Pulses: Normal pulses.  Pulmonary:     Effort: Pulmonary effort is normal.  Musculoskeletal:        General: Normal range of motion.     Cervical back: Normal range of motion.  Skin:    General: Skin is warm.  Neurological:     Mental Status: He is alert.  Psychiatric:        Mood and Affect: Mood normal.     BP 130/75   Pulse 60   Ht _0  (1.803 m)   Wt 201 lb 4.8 oz (91.3 kg)   BMI 28.08 kg/m  Wt Readings from Last 3 Encounters:  08/03/20 201 lb 4.8 oz (91.3 kg)  07/06/20 194 lb 3.2 oz (88.1 kg)  06/01/20 186 lb 9.6 oz (84.6 kg)    Health  Maintenance Due  Topic Date Due  . Hepatitis C Screening  Never done  . HIV Screening  Never done    There are no preventive care reminders to display for this patient.  CBC Latest Ref Rng & Units 06/01/2020  WBC 3.8 - 10.8 Thousand/uL 6.9  Hemoglobin 13.2 - 17.1 g/dL 16.1  Hematocrit 38 - 50 % 46.5  Platelets 140 - 400 Thousand/uL 227   CMP Latest Ref Rng & Units 08/03/2020 06/01/2020  Glucose 65 - 99 mg/dL - 91  BUN 7 - 25 mg/dL - 13  Creatinine 0.60 - 1.35 mg/dL - 1.02  Sodium 135 - 146 mmol/L - 140  Potassium 3.5 - 5.3 mmol/L - 4.3  Chloride 98 - 110 mmol/L - 102  CO2 20 - 32 mmol/L - 24  Calcium 8.6 - 10.3 mg/dL - 9.5  Total Protein 6.1 - 8.1 g/dL - 7.5  Total Bilirubin 0.2 - 1.2 mg/dL 0.9 1.3(H)  AST 10 - 40 U/L - 23  ALT 9 - 46 U/L - 16    Lab Results  Component Value Date   TSH 1.69 06/01/2020   No results found for: ALBUMIN, ANIONGAP, EGFR, GFR Lab Results  Component Value Date   CHOL 152 06/01/2020   HDL 55 06/01/2020   LDLCALC 82 06/01/2020   CHOLHDL 2.8 06/01/2020   Lab Results  Component Value Date   TRIG 70 06/01/2020   No results found for: HGBA1C    ASSESSMENT & PLAN:   Problem List Items Addressed This Visit      Cardiovascular and Mediastinum   Essential hypertension    Patient's blood pressure is within the desired range. Medication side effects include: no side effects noted Continue current treatment regimen.        Other Visit Diagnoses    Elevated bilirubin    -  Primary   Relevant Orders   Bilirubin, total (Completed)      No orders of the defined types were placed in this encounter.      Follow-up: No follow-ups on file.    Beckie Salts, [place] [address] [place], [place] [address]

## 2020-08-17 NOTE — Assessment & Plan Note (Signed)
Patient's blood pressure is within the desired range. Medication side effects include: no side effects noted Continue current treatment regimen.  

## 2021-01-31 ENCOUNTER — Ambulatory Visit: Payer: BC Managed Care – PPO | Admitting: Internal Medicine

## 2022-02-12 DIAGNOSIS — B078 Other viral warts: Secondary | ICD-10-CM | POA: Diagnosis not present

## 2022-02-12 DIAGNOSIS — D485 Neoplasm of uncertain behavior of skin: Secondary | ICD-10-CM | POA: Diagnosis not present

## 2022-03-26 ENCOUNTER — Ambulatory Visit (INDEPENDENT_AMBULATORY_CARE_PROVIDER_SITE_OTHER): Payer: BC Managed Care – PPO | Admitting: Nurse Practitioner

## 2022-03-26 ENCOUNTER — Encounter: Payer: Self-pay | Admitting: Nurse Practitioner

## 2022-03-26 VITALS — BP 118/70 | HR 56 | Temp 97.5°F | Ht 71.0 in | Wt 207.2 lb

## 2022-03-26 DIAGNOSIS — Z0000 Encounter for general adult medical examination without abnormal findings: Secondary | ICD-10-CM | POA: Diagnosis not present

## 2022-03-26 DIAGNOSIS — N62 Hypertrophy of breast: Secondary | ICD-10-CM | POA: Diagnosis not present

## 2022-03-26 DIAGNOSIS — I10 Essential (primary) hypertension: Secondary | ICD-10-CM | POA: Diagnosis not present

## 2022-03-26 NOTE — Progress Notes (Signed)
New Patient Office Visit  Subjective    Patient ID: Logan Rivas, male    DOB: 27-May-1986  Age: 36 y.o. MRN: [id number]  CC:  Chief Complaint  Patient presents with   Establish Care    No concerns     HPI Logan Rivas presents to establish care  HTN: historical dx and was placed on lopressor. No longer currenlty on medication. Does not check blood pressure at home. Has   for complete physical and follow up of chronic conditions.  Immunizations: -Tetanus:2016 -Influenza:out of season  -Covid-19:pfizer x2 -Shingles: -Pneumonia:   -HPV:  Diet: Fair diet. Will fast on Wednesday and Friday. Tries to eat lean meats. Does not eat fast food. Water and coffee.  Exercise: No regular exercise. Depends on schedule 2-3 times a week at 40-hour  Eye exam: Completes annually  Dental exam: Completes semi-annually    Colonoscopy: Too young Lung Cancer Screening: NA Dexa: Completed in  PSA:  Too young. Prostate cancer in granpa and dx in 60s.  Sleep: Goes to bed aorund [id number]. Feels rested. Has been told he snores when he is heavier      Outpatient Encounter Medications as of 03/26/2022  Medication Sig   metoprolol succinate (TOPROL-XL) 50 MG 24 hr tablet Take 1 tablet (50 mg total) by mouth daily. Take with or immediately following a meal. (Patient not taking: Reported on 03/26/2022)   No facility-administered encounter medications on file as of 03/26/2022.    Past Medical History:  Diagnosis Date   High blood pressure     No past surgical history on file.  Family History  Problem Relation Age of Onset   Hypertension Mother    Hypertension Father    Stroke Paternal Aunt    Hypertension Paternal Aunt    Cancer Maternal Grandfather        stomach/lung   Cancer Paternal Grandfather        prostate    Social History   Socioeconomic History   Marital status: Single    Spouse name: Not on file   Number of children: 0   Years of education: Not on file    Highest education level: High school graduate  Occupational History   Occupation: office work  Tobacco Use   Smoking status: Never   Smokeless tobacco: Never  Vaping Use   Vaping Use: Never used  Substance and Sexual Activity   Alcohol use: Yes    Comment: couples times a week mostly beer will have 1-3 beers a setting   Drug use: Never   Sexual activity: Yes  Other Topics Concern   Not on file  Social History Narrative   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  Respiratory:  Negative for shortness of breath.   Cardiovascular:  Negative for chest pain and leg swelling.  Gastrointestinal:  Negative for abdominal pain, blood in stool, diarrhea, nausea and vomiting.       BM every 2-3 days  Genitourinary:  Negative for dysuria and hematuria.       Nocturia negative   Neurological:  Negative for dizziness, tingling and headaches.  Psychiatric/Behavioral:  Negative for hallucinations and suicidal ideas.         Objective    BP 118/70   Pulse (!) 56   Temp (!) 97.5 F (36.4 C) (Temporal)   Ht 5\' 11"  (1.803 m)   Wt 207 lb 4 oz (94 kg)   SpO2 97%   BMI 28.91 kg/m   Physical Exam Vitals and nursing note reviewed. Exam conducted with a chaperone present Odee, CMA).  Constitutional:      Appearance: Normal appearance.  HENT:     Right Ear: Tympanic membrane, ear canal and external ear normal.     Left Ear: Tympanic membrane, ear canal and external ear normal.  Cardiovascular:     Rate and Rhythm: Normal rate and regular rhythm.  Pulmonary:     Effort: Pulmonary effort is normal.     Breath sounds:  Normal breath sounds.  Chest:     Comments: Patient concern for rounding of [place].  Was unable to palpate any granular tissue informs of gynecomastia.  States regardless of weight it is always there Abdominal:     General: Bowel sounds are normal. There is no distension.     Palpations: There is no mass.     Tenderness: There is no abdominal tenderness.     Hernia: No hernia is present. There is no hernia in the left inguinal area or right inguinal area.  Genitourinary:    Penis: Normal.      Testes:        Right: Testicular hydrocele present. Mass or tenderness not present.        Left: Mass or tenderness not present.     Epididymis:     Right: Normal.     Left: Normal.     Comments: Discussed findings on exam nonpainful to palpation no testicular abnormality.  Did offer patient ultrasound he deferred at current time do not think patient needs ultrasound currently continue to monitor Musculoskeletal:     Right lower leg: No edema.     Left lower leg: No edema.  Lymphadenopathy:     Cervical: No cervical adenopathy.     Lower Body: No right inguinal adenopathy. No left inguinal adenopathy.  Skin:    General: Skin is warm.  Neurological:     General: No focal deficit present.     Mental Status: He is alert.  Psychiatric:        Mood and Affect: Mood normal.        Behavior: Behavior normal.        Thought Content: Thought content normal.        Judgment: Judgment normal.         Assessment & Plan:   Problem List Items Addressed This Visit       Cardiovascular and Mediastinum   Essential hypertension    Patient no longer on medication.  Blood pressure within normal limit patient not obese and does exercise we will continue to monitor for now currently maintained on diet        Other   Preventative health care - Primary    Discussed age-appropriate immunizations and screening exams.      Relevant Orders   CBC   Comprehensive metabolic panel   TSH   Lipid panel    Gynecomastia, male    Working diagnosis currently on official diagnosis.  Patient would like his testosterone estrogen levels checked.  Due to time cannot do that today patient will reach out when he  is ready to have an early morning lab appointment we will place orders.  If that comes back negative we can consider mammogram to discern if he does have gynecomastia.  If so we can always have surgical referral       Return in about 1 year (around 03/27/2023) for CPE and labs.   Audria Nine, NP

## 2022-03-26 NOTE — Assessment & Plan Note (Signed)
Working diagnosis currently on official diagnosis.  Patient would like his testosterone estrogen levels checked.  Due to time cannot do that today patient will reach out when he is ready to have an early morning lab appointment we will place orders.  If that comes back negative we can consider mammogram to discern if he does have gynecomastia.  If so we can always have surgical referral

## 2022-03-26 NOTE — Patient Instructions (Signed)
Nice to see you today I will be in touch with the labs once I have them Follow up with me in 1 year, sooner if you need me Let me know when you want to check the hormones and we can get it scheduled and I will place the orders

## 2022-03-26 NOTE — Assessment & Plan Note (Signed)
Patient no longer on medication.  Blood pressure within normal limit patient not obese and does exercise we will continue to monitor for now currently maintained on diet

## 2022-03-26 NOTE — Assessment & Plan Note (Signed)
Discussed age-appropriate immunizations and screening exams. 

## 2022-03-27 LAB — CBC
HCT: 42.1 % (ref 39.0–52.0)
Hemoglobin: 14.3 g/dL (ref 13.0–17.0)
MCHC: 34 g/dL (ref 30.0–36.0)
MCV: 93.8 fl (ref 78.0–100.0)
Platelets: 179 10*3/uL (ref 150.0–400.0)
RBC: 4.49 Mil/uL (ref 4.22–5.81)
RDW: 13.2 % (ref 11.5–15.5)
WBC: 4.3 10*3/uL (ref 4.0–10.5)

## 2022-03-27 LAB — COMPREHENSIVE METABOLIC PANEL
ALT: 21 U/L (ref 0–53)
AST: 23 U/L (ref 0–37)
Albumin: 4.6 g/dL (ref 3.5–5.2)
Alkaline Phosphatase: 34 U/L — ABNORMAL LOW (ref 39–117)
BUN: 15 mg/dL (ref 6–23)
CO2: 28 mEq/L (ref 19–32)
Calcium: 8.9 mg/dL (ref 8.4–10.5)
Chloride: 101 mEq/L (ref 96–112)
Creatinine, Ser: 1.22 mg/dL (ref 0.40–1.50)
GFR: 77.29 mL/min (ref >60.00)
Glucose, Bld: 92 mg/dL (ref 70–99)
Potassium: 4 mEq/L (ref 3.5–5.1)
Sodium: 137 mEq/L (ref 135–145)
Total Bilirubin: 1 mg/dL (ref 0.2–1.2)
Total Protein: 6.8 g/dL (ref 6.0–8.3)

## 2022-03-27 LAB — LIPID PANEL
Cholesterol: 181 mg/dL (ref 0–200)
HDL: 49.5 mg/dL (ref >39.00)
LDL Cholesterol: 122 mg/dL — ABNORMAL HIGH (ref 0–99)
NonHDL: 131.64
Total CHOL/HDL Ratio: 4
Triglycerides: 48 mg/dL (ref 0.0–149.0)
VLDL: 9.6 mg/dL (ref 0.0–40.0)

## 2022-03-27 LAB — TSH: TSH: 2.52 u[IU]/mL (ref 0.35–5.50)

## 2022-03-28 ENCOUNTER — Other Ambulatory Visit: Payer: Self-pay | Admitting: Nurse Practitioner

## 2022-03-28 DIAGNOSIS — N62 Hypertrophy of breast: Secondary | ICD-10-CM
